# Patient Record
Sex: Male | Born: 1980 | Race: Black or African American | Hispanic: No | Marital: Single | Smoking: Current every day smoker
Health system: Southern US, Community
[De-identification: ages and names within clinical notes are randomized; demographics above are authoritative.]

---

## 1999-03-01 ENCOUNTER — Emergency Department (HOSPITAL_COMMUNITY): Admission: EM | Admit: 1999-03-01 | Discharge: 1999-03-01 | Payer: Self-pay | Admitting: Emergency Medicine

## 2004-05-24 ENCOUNTER — Emergency Department (HOSPITAL_COMMUNITY): Admission: EM | Admit: 2004-05-24 | Discharge: 2004-05-24 | Payer: Self-pay | Admitting: Emergency Medicine

## 2005-02-17 ENCOUNTER — Emergency Department (HOSPITAL_COMMUNITY): Admission: EM | Admit: 2005-02-17 | Discharge: 2005-02-17 | Payer: Self-pay | Admitting: Emergency Medicine

## 2009-12-15 ENCOUNTER — Emergency Department (HOSPITAL_COMMUNITY): Admission: EM | Admit: 2009-12-15 | Discharge: 2009-12-15 | Payer: Self-pay | Admitting: Emergency Medicine

## 2012-02-09 ENCOUNTER — Emergency Department (HOSPITAL_COMMUNITY): Payer: Self-pay

## 2012-02-09 ENCOUNTER — Encounter (HOSPITAL_COMMUNITY): Payer: Self-pay | Admitting: Emergency Medicine

## 2012-02-09 ENCOUNTER — Emergency Department (HOSPITAL_COMMUNITY)
Admission: EM | Admit: 2012-02-09 | Discharge: 2012-02-09 | Disposition: A | Discharge status: Home / Self Care | Payer: Self-pay | Attending: Emergency Medicine | Admitting: Emergency Medicine

## 2012-02-09 DIAGNOSIS — S0003XA Contusion of scalp, initial encounter: Secondary | ICD-10-CM | POA: Insufficient documentation

## 2012-02-09 DIAGNOSIS — R404 Transient alteration of awareness: Secondary | ICD-10-CM | POA: Insufficient documentation

## 2012-02-09 DIAGNOSIS — S0083XA Contusion of other part of head, initial encounter: Secondary | ICD-10-CM

## 2012-02-09 DIAGNOSIS — S82409A Unspecified fracture of shaft of unspecified fibula, initial encounter for closed fracture: Secondary | ICD-10-CM

## 2012-02-09 DIAGNOSIS — F17200 Nicotine dependence, unspecified, uncomplicated: Secondary | ICD-10-CM | POA: Insufficient documentation

## 2012-02-09 MED ORDER — OXYCODONE-ACETAMINOPHEN 5-325 MG PO TABS: 1.0000 | ORAL_TABLET | ORAL | Status: AC | PRN
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
## 2012-02-09 NOTE — ED Notes (Signed)
Pt also presents with left ankle swelling.

## 2016-11-25 ENCOUNTER — Ambulatory Visit (HOSPITAL_COMMUNITY)
Admission: EM | Admit: 2016-11-25 | Discharge: 2016-11-25 | Disposition: A | Discharge status: Home / Self Care | Payer: Self-pay | Attending: Emergency Medicine | Admitting: Emergency Medicine

## 2016-11-25 ENCOUNTER — Encounter (HOSPITAL_COMMUNITY): Payer: Self-pay | Admitting: Emergency Medicine

## 2016-11-25 DIAGNOSIS — S0081XA Abrasion of other part of head, initial encounter: Secondary | ICD-10-CM
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
## 2016-11-25 NOTE — Discharge Instructions (Signed)
The abrasion is healing well. Make sure to wash it with soap and water twice a day. You can switch to Vaseline once or twice a day. Follow-up as needed.
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
## 2018-06-25 ENCOUNTER — Emergency Department (HOSPITAL_COMMUNITY): Payer: Self-pay

## 2018-06-25 ENCOUNTER — Emergency Department (HOSPITAL_COMMUNITY)
Admission: EM | Admit: 2018-06-25 | Discharge: 2018-06-25 | Disposition: A | Discharge status: Home / Self Care | Payer: Self-pay | Attending: Emergency Medicine | Admitting: Emergency Medicine

## 2018-06-25 ENCOUNTER — Other Ambulatory Visit: Payer: Self-pay

## 2018-06-25 ENCOUNTER — Encounter (HOSPITAL_COMMUNITY): Payer: Self-pay | Admitting: Emergency Medicine

## 2018-06-25 DIAGNOSIS — F17210 Nicotine dependence, cigarettes, uncomplicated: Secondary | ICD-10-CM | POA: Insufficient documentation

## 2018-06-25 DIAGNOSIS — R1084 Generalized abdominal pain: Secondary | ICD-10-CM

## 2018-06-25 DIAGNOSIS — K2900 Acute gastritis without bleeding: Secondary | ICD-10-CM | POA: Insufficient documentation

## 2018-06-25 DIAGNOSIS — K5909 Other constipation: Secondary | ICD-10-CM | POA: Insufficient documentation

## 2018-06-25 LAB — CBC
HCT: 47.3 % (ref 39.0–52.0)
Hemoglobin: 15.7 g/dL (ref 13.0–17.0)
MCH: 33.3 pg (ref 26.0–34.0)
MCHC: 33.2 g/dL (ref 30.0–36.0)
MCV: 100.2 fL — AB (ref 78.0–100.0)
PLATELETS: 289 10*3/uL (ref 150–400)
RBC: 4.72 MIL/uL (ref 4.22–5.81)
RDW: 12.6 % (ref 11.5–15.5)
WBC: 5.2 10*3/uL (ref 4.0–10.5)

## 2018-06-25 LAB — COMPREHENSIVE METABOLIC PANEL
ALT: 18 U/L (ref 0–44)
ANION GAP: 9 (ref 5–15)
AST: 24 U/L (ref 15–41)
Albumin: 4.2 g/dL (ref 3.5–5.0)
Alkaline Phosphatase: 72 U/L (ref 38–126)
BUN: 5 mg/dL — ABNORMAL LOW (ref 6–20)
CHLORIDE: 103 mmol/L (ref 98–111)
CO2: 27 mmol/L (ref 22–32)
CREATININE: 0.86 mg/dL (ref 0.61–1.24)
Calcium: 9.3 mg/dL (ref 8.9–10.3)
GFR calc non Af Amer: >60 mL/min (ref >60)
Glucose, Bld: 127 mg/dL — ABNORMAL HIGH (ref 70–99)
Potassium: 3.5 mmol/L (ref 3.5–5.1)
SODIUM: 139 mmol/L (ref 135–145)
Total Bilirubin: 0.6 mg/dL (ref 0.3–1.2)
Total Protein: 7 g/dL (ref 6.5–8.1)

## 2018-06-25 LAB — URINALYSIS, ROUTINE W REFLEX MICROSCOPIC
Bacteria, UA: NONE SEEN
GLUCOSE, UA: NEGATIVE mg/dL
HGB URINE DIPSTICK: NEGATIVE
KETONES UR: NEGATIVE mg/dL
Nitrite: NEGATIVE
PROTEIN: 30 mg/dL — AB
Specific Gravity, Urine: 1.028 (ref 1.005–1.030)
pH: 5 (ref 5.0–8.0)

## 2018-06-25 LAB — LIPASE, BLOOD: LIPASE: 40 U/L (ref 11–51)

## 2018-06-25 MED ORDER — SODIUM CHLORIDE 0.9 % IV BOLUS
1000.0000 mL | Freq: Once | INTRAVENOUS | Status: AC
  Administered 2018-06-25: 1000 mL via INTRAVENOUS

## 2018-06-25 MED ORDER — IOHEXOL 300 MG/ML  SOLN
100.0000 mL | Freq: Once | INTRAMUSCULAR | Status: AC | PRN
  Administered 2018-06-25: 100 mL via INTRAVENOUS

## 2018-06-25 MED ORDER — SUCRALFATE 1 G PO TABS: 1.0000 g | ORAL_TABLET | Freq: Three times a day (TID) | ORAL | 0 refills | Status: AC

## 2018-06-25 MED ORDER — DOCUSATE SODIUM 100 MG PO CAPS: 100.0000 mg | ORAL_CAPSULE | Freq: Two times a day (BID) | ORAL | 0 refills | Status: AC

## 2018-06-25 MED ORDER — LACTULOSE 10 GM/15ML PO SOLN: 10.0000 g | Freq: Two times a day (BID) | ORAL | 0 refills | Status: AC | PRN

## 2018-06-25 MED ORDER — IOHEXOL 300 MG/ML  SOLN: 100.0000 mL | Freq: Once | INTRAMUSCULAR | Status: DC | PRN

## 2018-06-25 MED ORDER — FAMOTIDINE 20 MG PO TABS: 20.0000 mg | ORAL_TABLET | Freq: Two times a day (BID) | ORAL | 0 refills | Status: AC

## 2018-06-25 NOTE — ED Triage Notes (Signed)
Pt reports abd pain and constipation since 7/5, took laxative yesterday which helped.

## 2018-06-25 NOTE — ED Notes (Signed)
Patient transported to CT 

## 2018-06-25 NOTE — Discharge Instructions (Signed)
Return here as needed. Follow up with the specialist provided as needed. Increase your fluid intake.

## 2018-06-25 NOTE — ED Provider Notes (Signed)
[facility] EMERGENCY DEPARTMENT Provider Note   CSN: [id number] Arrival date & time: 06/25/18  7829     History   Chief Complaint Chief Complaint  Patient presents with  . Constipation  . Abdominal Pain    HPI Timothy Erickson is a 37 y.o. male.  HPI Patient presents to the emergency department with abdominal discomfort and constipation that started on July 5.  The patient states that he took a laxative yesterday which seemed to help with his symptoms.  Patient states that he still continues with intermittent abdominal pain patient states pain is not constant at this time and the first 2 days he did have nausea vomiting.  Patient states he has not had any diarrhea.  Patient states he did not take any other medications prior to arrival for his symptoms.  The patient denies chest pain, shortness of breath, headache,blurred vision, neck pain, fever, cough, weakness, numbness, dizziness, anorexia, edema,  diarrhea, rash, back pain, dysuria, hematemesis, bloody stool, near syncope, or syncope. History reviewed. No pertinent past medical history.  There are no active problems to display for this patient.   History reviewed. No pertinent surgical history.      Home Medications    Prior to Admission medications   Not on File    Family History No family history on file.  Social History Social History   Tobacco Use  . Smoking status: Current Every Day Smoker    Packs/day: 1.00    Types: Cigarettes  . Smokeless tobacco: Never Used  Substance Use Topics  . Alcohol use: No  . Drug use: Not on file     Allergies   Patient has no known allergies.   Review of Systems Review of Systems All other systems negative except as documented in the HPI. All pertinent positives and negatives as reviewed in the HPI.  Physical Exam Updated Vital Signs BP 130/90   Pulse 62   Temp 98.5 F (36.9 C) (Oral)   Resp 16   SpO2 100%   Physical Exam    Constitutional: He is oriented to person, place, and time. He appears well-developed and well-nourished. No distress.  HENT:  Head: Normocephalic and atraumatic.  Mouth/Throat: Oropharynx is clear and moist.  Eyes: Pupils are equal, round, and reactive to light.  Neck: Normal range of motion. Neck supple.  Cardiovascular: Normal rate, regular rhythm and normal heart sounds. Exam reveals no gallop and no friction rub.  No murmur heard. Pulmonary/Chest: Effort normal and breath sounds normal. No respiratory distress. He has no wheezes.  Abdominal: Soft. Bowel sounds are normal. He exhibits no distension. There is no tenderness. There is no rigidity and no guarding. No hernia.  Patient currently does not have abdominal pain on my exam although he was having it just prior to my arrival in the room.  Neurological: He is alert and oriented to person, place, and time. He exhibits normal muscle tone. Coordination normal.  Skin: Skin is warm and dry. Capillary refill takes less than 2 seconds. No rash noted. No erythema.  Psychiatric: He has a normal mood and affect. His behavior is normal.  Nursing note and vitals reviewed.    ED Treatments / Results  Labs (all labs ordered are listed, but only abnormal results are displayed) Labs Reviewed  COMPREHENSIVE METABOLIC PANEL - Abnormal; Notable for the following components:      Result Value   Glucose, Bld 127 (*)    BUN 5 (*)    All  other components within normal limits  CBC - Abnormal; Notable for the following components:   MCV 100.2 (*)    All other components within normal limits  URINALYSIS, ROUTINE W REFLEX MICROSCOPIC - Abnormal; Notable for the following components:   Color, Urine AMBER (*)    APPearance HAZY (*)    Bilirubin Urine SMALL (*)    Protein, ur 30 (*)    Leukocytes, UA SMALL (*)    All other components within normal limits  LIPASE, BLOOD    EKG None  Radiology Ct Abdomen Pelvis W Contrast  Result Date:  06/25/2018 CLINICAL DATA:  Diffuse abdominal pain with nausea and vomiting. EXAM: CT ABDOMEN AND PELVIS WITH CONTRAST TECHNIQUE: Multidetector CT imaging of the abdomen and pelvis was performed using the standard protocol following bolus administration of intravenous contrast. CONTRAST:  100mL OMNIPAQUE IOHEXOL 300 MG/ML  SOLN COMPARISON:  None. FINDINGS: Lower chest: No acute abnormality. Hepatobiliary: Low-attenuation structure within segment 8 measures 3 mm. Too small to characterize. No suspicious liver abnormality. The gallbladder appears normal. No biliary dilatation. Pancreas: Normal appearance of the pancreas. Spleen: Spleen negative. Adrenals/Urinary Tract: The adrenal glands are unremarkable. The kidneys both appear normal. No mass or hydronephrosis identified. The urinary bladder is normal. Stomach/Bowel: The stomach appears nondistended. No pathologic dilatation of the small bowel loops. No wall thickening or inflammation identified. The appendix is visualized in the right iliac fossa and appears normal. Unremarkable appearance of the colon. Vascular/Lymphatic: Normal appearance of the abdominal aorta. No enlarged retroperitoneal or mesenteric adenopathy. No enlarged pelvic or inguinal lymph nodes. Reproductive: Prostate is unremarkable. Other: No abdominal wall hernia or abnormality. No abdominopelvic ascites. Musculoskeletal: No acute or significant osseous findings. IMPRESSION: 1. No acute findings identified within the abdomen or pelvis. Electronically Signed   By: Signa Kellaylor  Stroud M.D.   On: 06/25/2018 12:39    Procedures Procedures (including critical care time)  Medications Ordered in ED Medications  sodium chloride 0.9 % bolus 1,000 mL (0 mLs Intravenous Stopped 06/25/18 1221)  iohexol (OMNIPAQUE) 300 MG/ML solution 100 mL (100 mLs Intravenous Contrast Given 06/25/18 1147)     Initial Impression / Assessment and Plan / ED Course  I have reviewed the triage vital signs and the nursing  notes.  Pertinent labs & imaging results that were available during my care of the patient were reviewed by me and considered in my medical decision making (see chart for details).     Patient was given IV fluids here in the emergency department and had a CT scan and laboratory testing performed.  The patient's laboratory testing does not show any significant abnormalities at this time.  I will treat the patient for constipation even though his CT scan did not get a read of any significant stool burden.  I have advised him he will need to follow-up with a primary doctor or urgent care.  Told to return here as needed.  Final Clinical Impressions(s) / ED Diagnoses   Final diagnoses:  None    ED Discharge Orders    None       Charlestine NightLawyer, Joseline Mccampbell, PA-C 06/25/18 1316    Linwood DibblesKnapp, Jon, MD 06/25/18 [phone]

## 2019-09-16 IMAGING — CT CT ABD-PELV W/ CM
2 of 4 series · 16 of 46 positions shown, 18 images · IV contrast (APPLIED)
Comparison: None.

CLINICAL DATA: Diffuse abdominal pain with nausea and vomiting.

EXAM:
CT ABDOMEN AND PELVIS WITH CONTRAST
TECHNIQUE: Multidetector CT imaging of the abdomen and pelvis was performed
using the standard protocol following bolus administration of
intravenous contrast.
CONTRAST:  100mL OMNIPAQUE IOHEXOL 300 MG/ML  SOLN

[Series 3: abdomen 5.0 · axial · 0.68mm/px · z∈[-826,-426]mm · 13 of 90 slices shown, 15 images]
[im 5/90  soft-tissue]
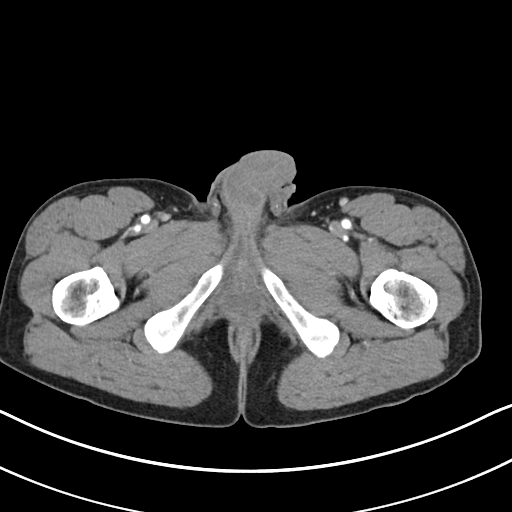
[im 5/90  bone]
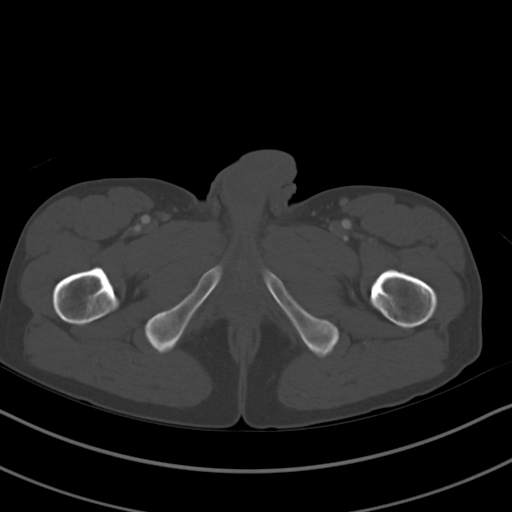
[im 10/90  soft-tissue]
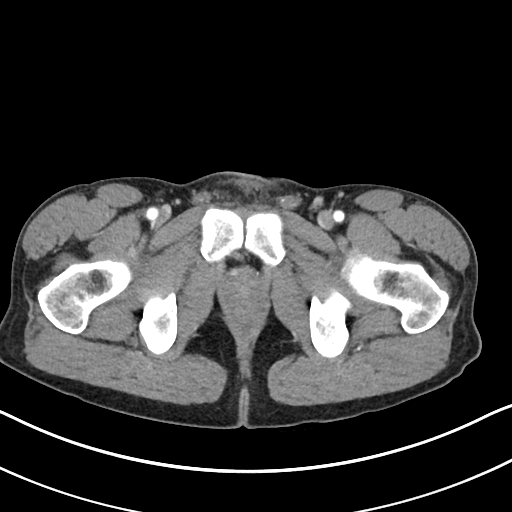
[im 20/90  soft-tissue]
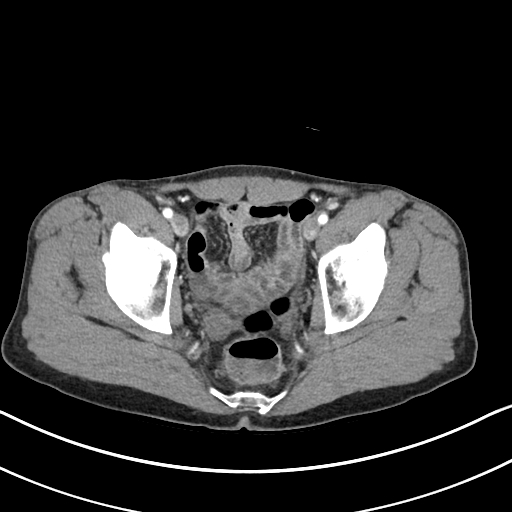
[im 25/90  soft-tissue]
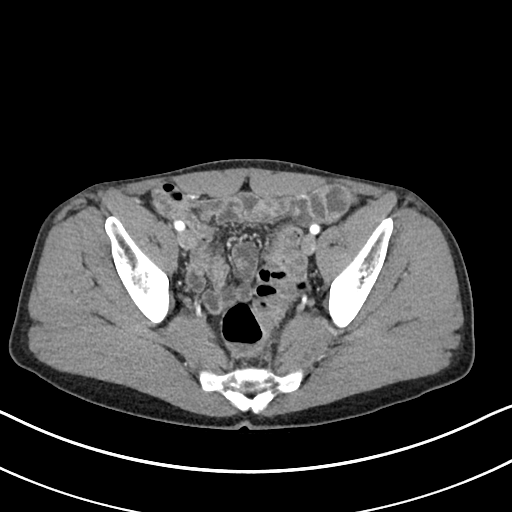
[im 30/90  soft-tissue]
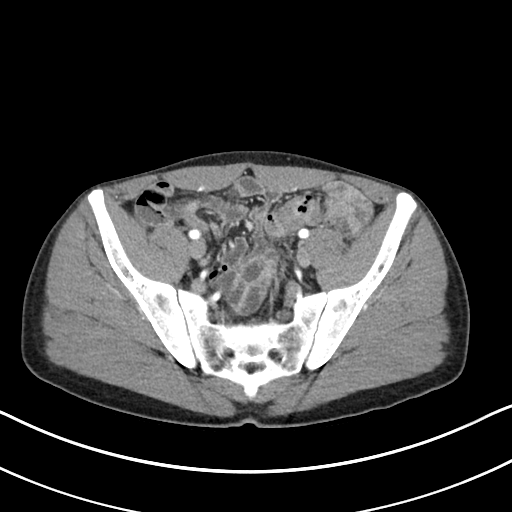
[im 40/90  soft-tissue]
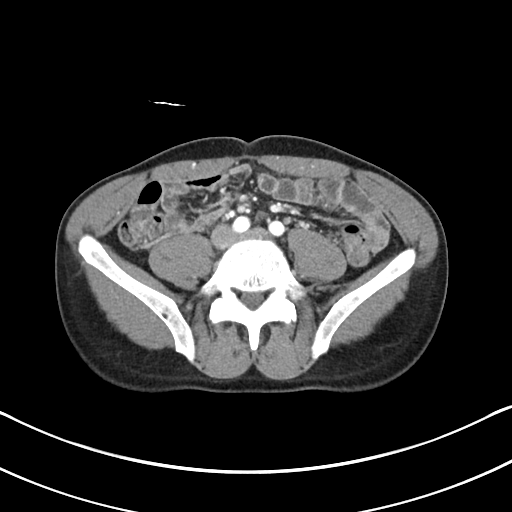
[im 45/90  soft-tissue]
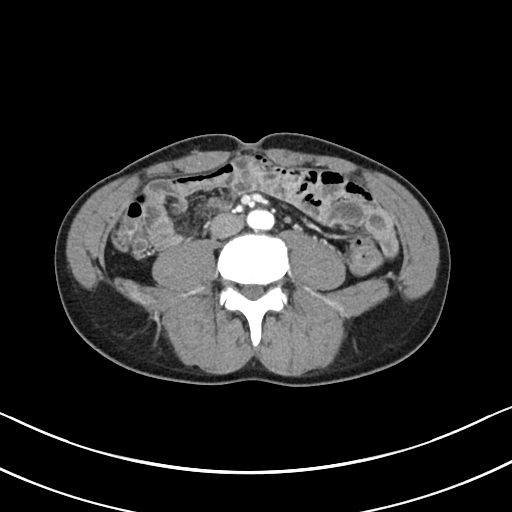
[im 50/90  soft-tissue]
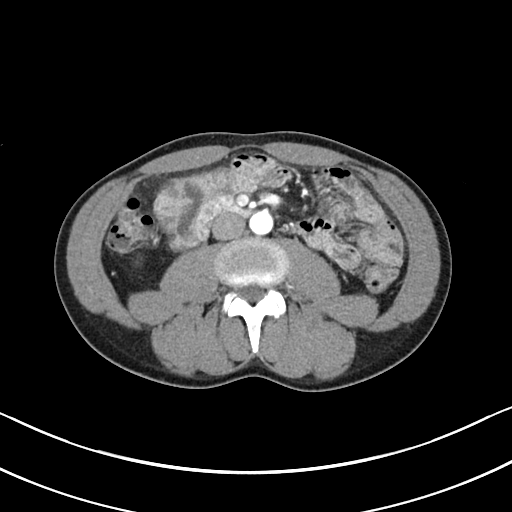
[im 60/90  soft-tissue]
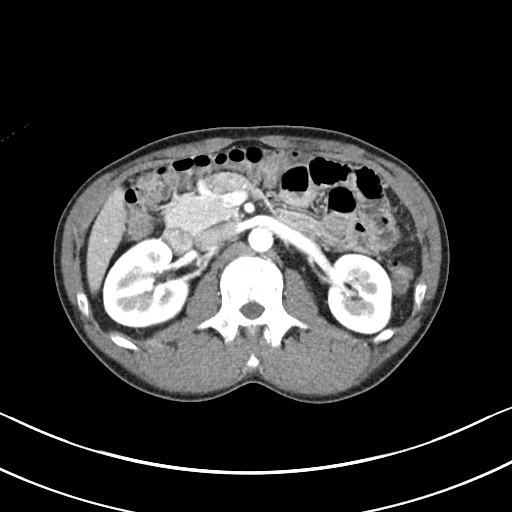
[im 60/90  bone]
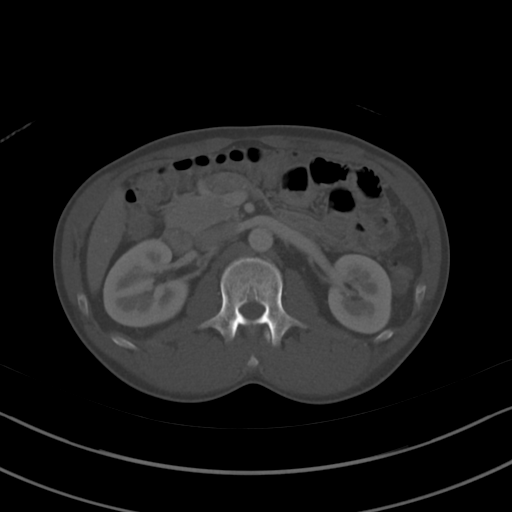
[im 65/90  soft-tissue]
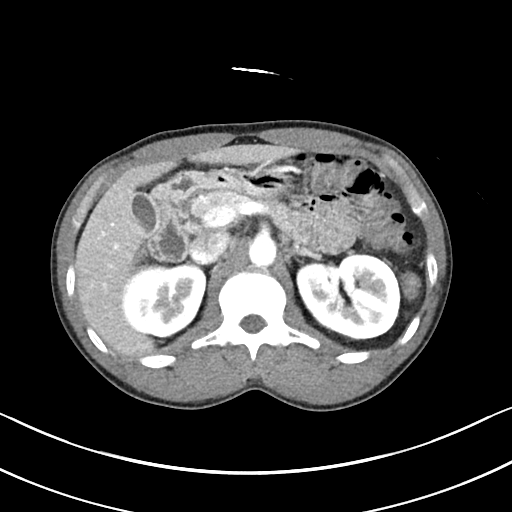
[im 70/90  soft-tissue]
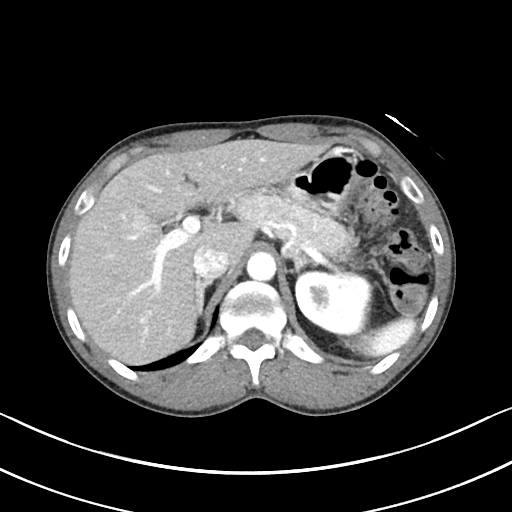
[im 80/90  soft-tissue]
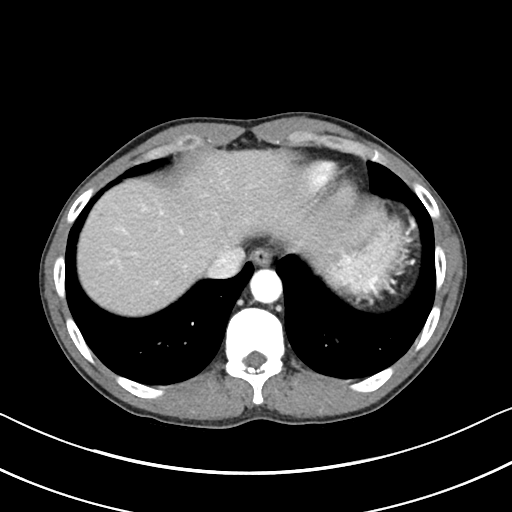
[im 85/90  soft-tissue]
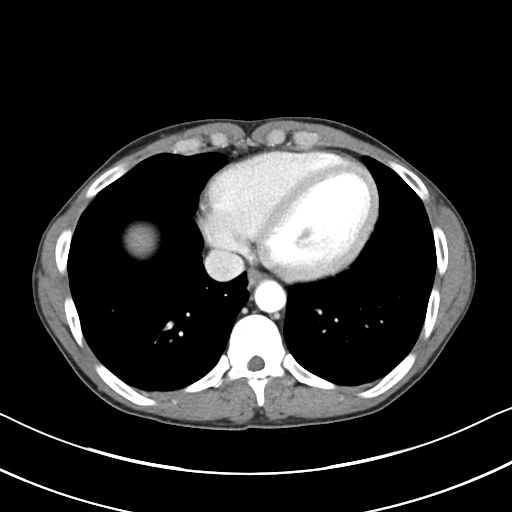

[Series 6: abdomen 3.0 mpr cor · coronal · 0.67mm/px · 3 of 67 slices shown]
[im 23/67  soft-tissue]
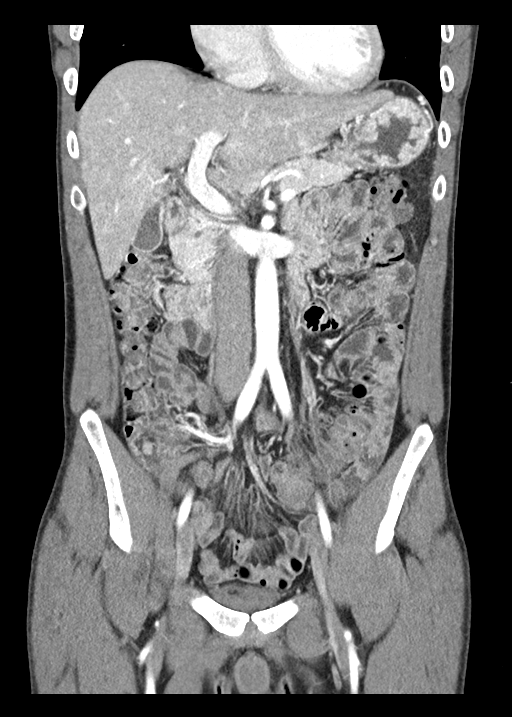
[im 30/67  soft-tissue]
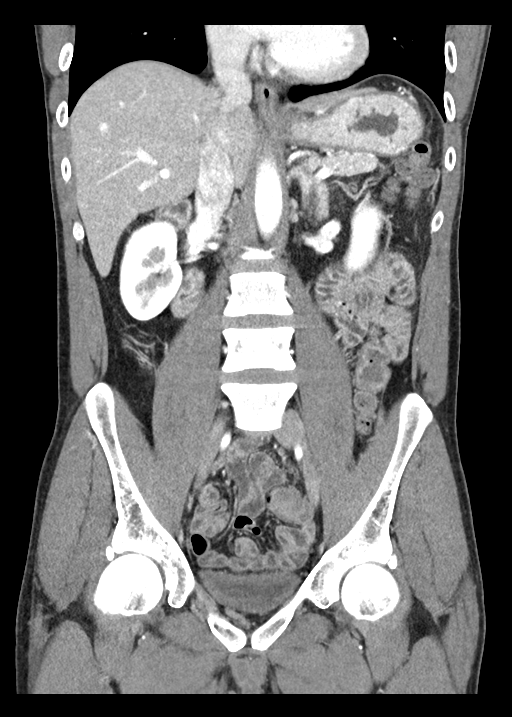
[im 37/67  soft-tissue]
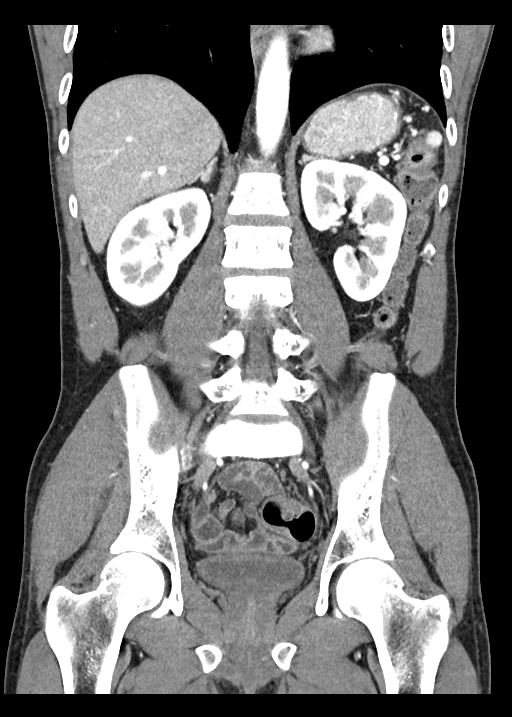

[16 of 46 positions shown; findings below may reference images not displayed]

FINDINGS: Lower chest: No acute abnormality.

Hepatobiliary: Low-attenuation structure within segment 8 measures 3
mm. Too small to characterize. No suspicious liver abnormality. The
gallbladder appears normal. No biliary dilatation.

Pancreas: Normal appearance of the pancreas.

Spleen: Spleen negative.

Adrenals/Urinary Tract: The adrenal glands are unremarkable.

The kidneys both appear normal. No mass or hydronephrosis
identified. The urinary bladder is normal.

Stomach/Bowel: The stomach appears nondistended. No pathologic
dilatation of the small bowel loops. No wall thickening or
inflammation identified. The appendix is visualized in the right
iliac fossa and appears normal. Unremarkable appearance of the
colon.

Vascular/Lymphatic: Normal appearance of the abdominal aorta. No
enlarged retroperitoneal or mesenteric adenopathy. No enlarged
pelvic or inguinal lymph nodes.

Reproductive: Prostate is unremarkable.

Other: No abdominal wall hernia or abnormality. No abdominopelvic
ascites.

Musculoskeletal: No acute or significant osseous findings.
IMPRESSION: 1. No acute findings identified within the abdomen or pelvis.
